# Patient Record
Sex: Male | Born: 1980 | Hispanic: No | Marital: Single | Smoking: Former smoker
Health system: Southern US, Community
[De-identification: ages and names within clinical notes are randomized; demographics above are authoritative.]

## PROBLEM LIST (undated history)

## (undated) DIAGNOSIS — M545 Low back pain: Secondary | ICD-10-CM

## (undated) DIAGNOSIS — G8929 Other chronic pain: Secondary | ICD-10-CM

## (undated) HISTORY — DX: Low back pain: M54.5

## (undated) HISTORY — DX: Other chronic pain: G89.29

## (undated) HISTORY — PX: COLONOSCOPY: SHX174

---

## 2014-11-02 ENCOUNTER — Ambulatory Visit (INDEPENDENT_AMBULATORY_CARE_PROVIDER_SITE_OTHER): Payer: PRIVATE HEALTH INSURANCE | Admitting: Family Medicine

## 2014-11-02 ENCOUNTER — Encounter: Payer: Self-pay | Admitting: Family Medicine

## 2014-11-02 VITALS — BP 137/86 | HR 84 | Ht 68.0 in | Wt 148.0 lb

## 2014-11-02 DIAGNOSIS — M545 Low back pain, unspecified: Secondary | ICD-10-CM | POA: Insufficient documentation

## 2014-11-02 DIAGNOSIS — G8929 Other chronic pain: Secondary | ICD-10-CM

## 2014-11-02 DIAGNOSIS — R011 Cardiac murmur, unspecified: Secondary | ICD-10-CM | POA: Diagnosis not present

## 2014-11-02 HISTORY — DX: Other chronic pain: G89.29

## 2014-11-02 HISTORY — DX: Low back pain, unspecified: M54.50

## 2014-11-02 NOTE — Assessment & Plan Note (Signed)
Unknown etiology of murmur. Echocardiogram ordered. Follow-up following echo. EKG normal. Patient asymptomatic.

## 2014-11-02 NOTE — Patient Instructions (Signed)
Thank you for coming in today.  1) Back pain:  Do the exercises below. Call or return when you are ready to do an xray and physical therapy.   2) Heart Murmur: We will schedule your for an ultrasound of the heart: Echocardiogram (Echo) this will tell us why you have a murmur.  Return following heart ultrasound.    Heart Murmur A heart murmur is an extra sound heard by your health care provider when listening to your heart with a device called a stethoscope. The sound comes from turbulence when blood flows through the heart and may be a "hum" or "whoosh" sound heard when the heart beats. There are two types of heart murmurs:  Innocent murmurs. Most people with this type of heart murmur do not have a heart problem. Many children have innocent heart murmurs. Your health care provider may suggest some basic testing to know whether your murmur is an innocent murmur. If an innocent heart murmur is found, there is no need for further tests or treatment and no need to restrict activities or stop playing sports.  Abnormal murmurs. These types of murmurs can occur in children and adults. In children, abnormal heart murmurs are typically caused from heart defects that are present at birth (congenital). In adults, abnormal murmurs are usually from heart valve problems caused by disease, infection, or aging. CAUSES  Normally, these valves open to let blood flow through or out of your heart and then shut to keep it from flowing backward. If they do not work properly, you could have:  Regurgitation--When blood leaks back through the valve in the wrong direction.  Mitral valve prolapse--When the mitral valve of the heart has a loose flap and does not close tightly.  Stenosis--When the valve does not open enough and blocks blood flow. SIGNS AND SYMPTOMS  Innocent murmurs do not cause symptoms, and many people with abnormal murmurs may or may not have symptoms. If symptoms do develop, they may  include:  Shortness of breath.  Blue coloring of the skin, especially on the fingertips.  Chest pain.  Palpitations, or feeling a fluttering or skipped heartbeat.  Fainting.  Persistent cough.  Getting tired much faster than expected. DIAGNOSIS  A heart murmur might be heard during a sports physical or during any type of examination. When a murmur is heard, it may suggest a possible problem. When this happens, your health care provider may ask you to see a heart specialist (cardiologist). You may also be asked to have one or more heart tests. In these cases, testing may vary depending on what your health care provider heard. Tests for a heart murmur may include:  Electrocardiogram.  Echocardiogram.  MRI. For children and adults who have an abnormal heart murmur and want to play sports, it is important to complete testing, review test results, and receive recommendations from your health care provider. If heart disease is present, it may not be safe to play. TREATMENT  Innocent murmurs require no treatment or activity restriction. If an abnormal murmur represents a problem with the heart, treatment will depend on the exact nature of the problem. In these cases, medicine or surgery may be needed to treat the problem. HOME CARE INSTRUCTIONS If you want to participate in sports or other types of strenuous physical activity, it is important to discuss this first with your health care provider. If the murmur represents a problem with the heart and you choose to participate in sports, there is a small chance that  a serious problem (including sudden death) could result.  SEEK MEDICAL CARE IF:   You feel that your symptoms are slowly worsening.  You develop any new symptoms that cause concern.  You feel that you are having side effects from any medicines prescribed. SEEK IMMEDIATE MEDICAL CARE IF:   You develop chest pain.  You have shortness of breath.  You notice that your heart  beats irregularly often enough to cause you to worry.  You have fainting spells.  Your symptoms suddenly get worse.   This information is not intended to replace advice given to you by your health care provider. Make sure you discuss any questions you have with your health care provider.   Document Released: 01/27/2004 Document Revised: 01/09/2014 Document Reviewed: 08/26/2012 Elsevier Interactive Patient Education 2016 [place] Injury Prevention Back injuries can be very painful. They can also be difficult to heal. After having one back injury, you are more likely to injure your back again. It is important to learn how to avoid injuring or re-injuring your back. The following tips can help you to prevent a back injury. WHAT SHOULD I KNOW ABOUT PHYSICAL FITNESS?  Exercise for 30 minutes per day on most days of the week or as told by your doctor. Make sure to:  Do aerobic exercises, such as walking, jogging, biking, or swimming.  Do exercises that increase balance and strength, such as tai chi and yoga.  Do stretching exercises. This helps with flexibility.  Try to develop strong belly (abdominal) muscles. Your belly muscles help to support your back.  Stay at a healthy weight. This helps to decrease your risk of a back injury. WHAT SHOULD I KNOW ABOUT MY DIET?  Talk with your doctor about your overall diet. Take supplements and vitamins only as told by your doctor.  Talk with your doctor about how much calcium and vitamin D you need each day. These nutrients help to prevent weakening of the bones (osteoporosis).  Include good sources of calcium in your diet, such as:  Dairy products.  Green leafy vegetables.  Products that have had calcium added to them (fortified).  Include good sources of vitamin D in your diet, such as:  Milk.  Foods that have had vitamin D added to them. WHAT SHOULD I KNOW ABOUT MY POSTURE?  Sit up straight and stand up straight. Avoid  leaning forward when you sit or hunching over when you stand.  Choose chairs that have good low-back (lumbar) support.  If you work at a desk, sit close to it so you do not need to lean over. Keep your chin tucked in. Keep your neck drawn back. Keep your elbows bent so your arms look like the letter "L" (right angle).  Sit high and close to the steering wheel when you drive. Add a low-back support to your car seat, if needed.  Avoid sitting or standing in one position for very long. Take breaks to get up, stretch, and walk around at least one time every hour. Take breaks every hour if you are driving for long periods of time.  Sleep on your side with your knees slightly bent, or sleep on your back with a pillow under your knees. Do not lie on the front of your body to sleep. WHAT SHOULD I KNOW ABOUT LIFTING, TWISTING, AND REACHING Lifting and Heavy Lifting  Avoid heavy lifting, especially lifting over and over again. If you must do heavy lifting:  Stretch before lifting.  Work slowly.  Rest between lifts.  Use a tool such as a cart or a dolly to move objects if one is available.  Make several small trips instead of carrying one heavy load.  Ask for help when you need it, especially when moving big objects.  Follow these steps when lifting:  Stand with your feet shoulder-width apart.  Get as close to the object as you can. Do not pick up a heavy object that is far from your body.  Use handles or lifting straps if they are available.  Bend at your knees. Squat down, but keep your heels off the floor.  Keep your shoulders back. Keep your chin tucked in. Keep your back straight.  Lift the object slowly while you tighten the muscles in your legs, belly, and butt. Keep the object as close to the center of your body as possible.  Follow these steps when putting down a heavy load:  Stand with your feet shoulder-width apart.  Lower the object slowly while you tighten the muscles  in your legs, belly, and butt. Keep the object as close to the center of your body as possible.  Keep your shoulders back. Keep your chin tucked in. Keep your back straight.  Bend at your knees. Squat down, but keep your heels off the floor.  Use handles or lifting straps if they are available. Twisting and Reaching  Avoid lifting heavy objects above your waist.  Do not twist at your waist while you are lifting or carrying a load. If you need to turn, move your feet.  Do not bend over without bending at your knees.  Avoid reaching over your head, across a table, or for an object on a high surface.  WHAT ARE SOME OTHER TIPS?  Avoid wet floors and icy ground. Keep sidewalks clear of ice to prevent falls.   Do not sleep on a mattress that is too soft or too hard.   Keep items that you use often within easy reach.   Put heavier objects on shelves at waist level, and put lighter objects on lower or higher shelves.  Find ways to lower your stress, such as:  Exercise.  Massage.  Relaxation techniques.  Talk with your doctor if you feel anxious or depressed. These conditions can make back pain worse.  Wear flat heel shoes with cushioned soles.  Avoid making quick (sudden) movements.  Use both shoulder straps when carrying a backpack.  Do not use any tobacco products, including cigarettes, chewing tobacco, or electronic cigarettes. If you need help quitting, ask your doctor.   This information is not intended to replace advice given to you by your health care provider. Make sure you discuss any questions you have with your health care provider.   Document Released: 06/07/2007 Document Revised: 05/05/2014 Document Reviewed: 12/23/2013 Elsevier Interactive Patient Education [employer].

## 2014-11-02 NOTE — Progress Notes (Signed)
Julian Martinez is a 34 y.o. male who [facility]  today for establish care and discuss back pain. 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 He denies any specific injury. He notes low back pain and tightness with an following work. He denies any radiating pain weakness or numbness fevers or chills. He denies any chest pains palpitations shortness of breath. He denies any bowel bladder dysfunction. He denies any personal history of heart murmur palpitations trouble breathing.   History reviewed. No pertinent past medical history. History reviewed. No pertinent past surgical history. Social History  Substance Use Topics  . Smoking status: Former Games developermoker  . Smokeless tobacco: Not on file  . Alcohol Use: 0.0 oz/week    0 Standard drinks or equivalent per week   family history includes Heart disease in his paternal grandmother.  ROS as above Medications: No current outpatient prescriptions on file.   No current facility-administered medications for this visit.   No Known Allergies   Exam:  BP 137/86 mmHg  Pulse 84  Ht 5\' 8"  (1.727 m)  Wt 148 lb (67.132 kg)  BMI 22.51 kg/m2 Gen: Well NAD HEENT: EOMI,  MMM Lungs: Normal work of breathing. CTABL Heart: RRR  3/6 systolic murmur best heard at the left lower sternal border nonradiating to the neck.  Pulses are symmetric in upper and lower extremity. Abd: NABS, Soft. Nondistended, Nontender  Exts: Brisk capillary refill, warm and well perfused.  Back: Nontender to midline. Tender palpation mildly bilateral lumbar paraspinals and SI joint. Normal back range of motion. Negative Faber test history leg raise test bilaterally. Lower extremity Strength and reflexes are intact. Normal gait. Sensation is intact throughout  Twelve-lead EKG shows normal sinus rhythm 86 bpm. No ST segment elevation or depression. Normal EKG. No prior EKGs to  compare to.  No results found for this or any previous visit (from the past 24 hour(s)). No results found.   Please see individual assessment and plan sections.

## 2014-11-02 NOTE — Addendum Note (Signed)
Addended by: Minna AntisBRIGHAM, EBONY T on: 11/02/2014 03:47 PM   Modules accepted: Orders

## 2014-11-02 NOTE — Assessment & Plan Note (Signed)
Patient's chief complaint today. I suspect his pain is likely due to muscle soreness and tightness. Discussed options. Patient declined x-ray and refer to physical therapy at this time. We'll home exercises and return if not better.

## 2014-11-10 ENCOUNTER — Ambulatory Visit: Payer: PRIVATE HEALTH INSURANCE

## 2016-08-23 ENCOUNTER — Ambulatory Visit (INDEPENDENT_AMBULATORY_CARE_PROVIDER_SITE_OTHER): Payer: PRIVATE HEALTH INSURANCE | Admitting: Family Medicine

## 2016-08-23 ENCOUNTER — Encounter: Payer: Self-pay | Admitting: Family Medicine

## 2016-08-23 VITALS — BP 123/80 | HR 93 | Temp 98.5°F | Wt 148.0 lb

## 2016-08-23 DIAGNOSIS — K625 Hemorrhage of anus and rectum: Secondary | ICD-10-CM | POA: Diagnosis not present

## 2016-08-23 LAB — CBC
HEMATOCRIT: 49.8 % (ref 38.5–50.0)
Hemoglobin: 17.1 g/dL (ref 13.2–17.1)
MCH: 34.1 pg — AB (ref 27.0–33.0)
MCHC: 34.3 g/dL (ref 32.0–36.0)
MCV: 99.4 fL (ref 80.0–100.0)
MPV: 8.6 fL (ref 7.5–12.5)
Platelets: 302 10*3/uL (ref 140–400)
RBC: 5.01 MIL/uL (ref 4.20–5.80)
RDW: 13.6 % (ref 11.0–15.0)
WBC: 5.1 10*3/uL (ref 3.8–10.8)

## 2016-08-23 NOTE — Patient Instructions (Signed)
Thank you for coming in today. Get labs today.  You should hear from [facility] soon.  Let me know if you do not hear from them next week.   If your belly pain worsens, or you have high fever, bad vomiting, significant worse blood in your stool or black tarry stool go to the Emergency Room.    Rectal Bleeding Rectal bleeding is when blood passes out of the anus. People with rectal bleeding may notice bright red blood in their underwear or in the toilet after having a bowel movement. They may also have dark red or black stools. Rectal bleeding is usually a sign that something is wrong. Many things can cause rectal bleeding, including:  Hemorrhoids. These are blood vessels in the anus or rectum that are larger than normal.  Fistulas. These are abnormal passages in the rectum and anus.  Anal fissures. This is a tear in the anus.  Diverticulosis. This is a condition in which pockets or sacs project from the bowel.  Proctitis and colitis. These are conditions in which the rectum, colon, or anus become inflamed.  Polyps. These are growths that can be cancerous (malignant) or non-cancerous (benign).  Part of the rectum sticking out from the anus (rectal prolapse).  Certain medicines.  Intestinal infections.  Follow these instructions at home: Pay attention to any changes in your symptoms. Take these actions to help lessen bleeding and discomfort:  Eat a diet that is high in fiber. This will keep your stool soft, making it easier to pass stools without straining. Ask your health care provider what foods and drinks are high in fiber.  Drink enough fluid to keep your urine clear or pale yellow. This also helps to keep your stool soft.  Try taking a warm bath. This may help soothe any pain in your rectum.  Keep all follow-up visits as told by your health care provider. This is important.  Get help right away if:  You have new or increased rectal bleeding.  You have black or  dark red stools.  You vomit blood or something that looks like coffee grounds.  You have pain or tenderness in your abdomen.  You have a fever.  You feel weak.  You feel nauseous.  You faint.  You have severe pain in your rectum.  You cannot have a bowel movement. This information is not intended to replace advice given to you by your health care provider. Make sure you discuss any questions you have with your health care provider. Document Released: 06/10/2001 Document Revised: 05/27/2015 Document Reviewed: 02/14/2015 Elsevier Interactive Patient Education  [employer].

## 2016-08-23 NOTE — Progress Notes (Signed)
       Jerardo Markwood is a 36 y.o. male who presents to [facility] today for bright red blood per rectum. Patient has a several week history of intermittent pain with rectal bleeding. This occurs after bowel movements. He notes the blood is bright red and in the toilet bowl and on the toilet paper. He denies any fevers chills nausea vomiting or diarrhea. He denies significant constipation. He feels well otherwise. He has not tried any treatment for this.   Past Medical History:  Diagnosis Date  . Chronic bilateral low back pain without sciatica 11/02/2014   No past surgical history on file. Social History  Substance Use Topics  . Smoking status: Former Games developer  . Smokeless tobacco: Not on file  . Alcohol use 0.0 oz/week   family history includes Heart disease in his paternal grandmother.  ROS as above:  Medications: No current outpatient prescriptions on file.   No current facility-administered medications for this visit.    No Known Allergies  Health Maintenance Health Maintenance  Topic Date Due  . HIV Screening  01/11/1995  . TETANUS/TDAP  01/11/1999  . INFLUENZA VACCINE  08/23/2017 (Originally 08/02/2016)     Exam:  BP 123/80   Pulse 93   Temp 98.5 F (36.9 C) (Oral)   Wt 148 lb (67.1 kg)   BMI 22.50 kg/m   Gen: Well NAD HEENT: EOMI,  MMM Lungs: Normal work of breathing. CTABL Heart: RRR no MRG Heart rate 90 bpm per my check Abd: NABS, Soft. Nondistended, Nontender Exts: Brisk capillary refill, warm and well perfused.  Rectal exam shows normal-appearing anus. No rectal masses nontender normal sized prostate. No visible blood.   No results found for this or any previous visit (from the past 72 hour(s)). No results found.    Assessment and Plan: 36 y.o. male with intermittent painless rectal bleeding. This is concerning for internal  hemorrhoid or diverticulosis. However it should be worked up. Plan for CBC and metabolic panel and refer to gastroenterology for likely colonoscopy. Recheck as needed.   Orders Placed This Encounter  Procedures  . CBC  . COMPLETE METABOLIC PANEL WITH GFR  . Ambulatory referral to Gastroenterology    Referral Priority:   Routine    Referral Type:   Consultation    Referral Reason:   Specialty Services Required    Requested Specialty:   Gastroenterology    Number of Visits Requested:   1   No orders of the defined types were placed in this encounter.    Discussed warning signs or symptoms. Please see discharge instructions. Patient expresses understanding.

## 2016-08-24 LAB — COMPLETE METABOLIC PANEL WITH GFR
ALT: 27 U/L (ref 9–46)
AST: 30 U/L (ref 10–40)
Albumin: 4.7 g/dL (ref 3.6–5.1)
Alkaline Phosphatase: 76 U/L (ref 40–115)
BUN: 12 mg/dL (ref 7–25)
CALCIUM: 9.9 mg/dL (ref 8.6–10.3)
CHLORIDE: 102 mmol/L (ref 98–110)
CO2: 22 mmol/L (ref 20–32)
CREATININE: 0.94 mg/dL (ref 0.60–1.35)
Glucose, Bld: 80 mg/dL (ref 65–99)
Potassium: 4.4 mmol/L (ref 3.5–5.3)
Sodium: 138 mmol/L (ref 135–146)
Total Bilirubin: 0.7 mg/dL (ref 0.2–1.2)
Total Protein: 7.6 g/dL (ref 6.1–8.1)

## 2016-10-27 LAB — HM COLONOSCOPY

## 2018-06-28 ENCOUNTER — Encounter (HOSPITAL_COMMUNITY): Payer: Self-pay

## 2018-06-28 ENCOUNTER — Ambulatory Visit (INDEPENDENT_AMBULATORY_CARE_PROVIDER_SITE_OTHER): Payer: PRIVATE HEALTH INSURANCE

## 2018-06-28 ENCOUNTER — Other Ambulatory Visit: Payer: Self-pay

## 2018-06-28 ENCOUNTER — Ambulatory Visit (HOSPITAL_COMMUNITY)
Admission: EM | Admit: 2018-06-28 | Discharge: 2018-06-28 | Disposition: A | Discharge status: Home / Self Care | Payer: PRIVATE HEALTH INSURANCE | Attending: Family Medicine | Admitting: Family Medicine

## 2018-06-28 DIAGNOSIS — S2222XA Fracture of body of sternum, initial encounter for closed fracture: Secondary | ICD-10-CM

## 2018-06-28 MED ORDER — IBUPROFEN 800 MG PO TABS: 800.0000 mg | ORAL_TABLET | Freq: Three times a day (TID) | ORAL | 0 refills | Status: DC | PRN

## 2018-06-28 NOTE — ED Provider Notes (Signed)
[facility]    CSN: [id number] Arrival date & time: 06/28/18  [id number]      History   Chief Complaint Chief Complaint  Patient presents with  . Appointment    8:50  . Pulled Muscle    HPI Julian Martinez is a 38 y.o. male.   HPI  Here with rib and anteroir chest wall pain.  Was seen at an urgent care center 3 d ago and told to return if not improving. His original injury is not severe - he was giving an old friend a bear hug, and the man resisted- joking- and he felt a pop an they both heard a pop in his chest.  He has had pain ever since.  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  H eis having increasing difficulty with the heavy work and increasing pain in his anterior chest Nonsmoker healthy  Past Medical History:  Diagnosis Date  . Chronic bilateral low back pain without sciatica 11/02/2014    Patient Active Problem List   Diagnosis Date Noted  . BRBPR (bright red blood per rectum) 08/23/2016  . Chronic bilateral low back pain without sciatica 11/02/2014    History reviewed. No pertinent surgical history.     Home Medications    Prior to Admission medications   Medication Sig Start Date End Date Taking? Authorizing Provider  ibuprofen (ADVIL) 800 MG tablet Take 1 tablet (800 mg total) by mouth every 8 (eight) hours as needed for moderate pain. 06/28/18   Eustace MooreNelson, Estevon Fluke Sue, MD    Family History Family History  Problem Relation Age of Onset  . Heart disease Paternal Grandmother   . Healthy Mother   . Healthy Father     Social History Social History   Tobacco Use  . Smoking status: Former Games developermoker  . Smokeless tobacco: Never Used  Substance Use Topics  . Alcohol use: Yes    Alcohol/week: 0.0 standard drinks    Comment: daily  . Drug use: Not on file     Allergies   Patient has no known allergies.   Review of Systems Review of Systems  Constitutional: Negative for chills and  fever.  HENT: Negative for ear pain and sore throat.   Eyes: Negative for pain and visual disturbance.  Respiratory: Positive for chest tightness. Negative for cough and shortness of breath.   Cardiovascular: Positive for chest pain. Negative for palpitations.  Gastrointestinal: Negative for abdominal pain and vomiting.  Genitourinary: Negative for dysuria and hematuria.  Musculoskeletal: Negative for arthralgias and back pain.  Skin: Negative for color change and rash.  Neurological: Negative for seizures and syncope.  All other systems reviewed and are negative.    Physical Exam Triage Vital Signs ED Triage Vitals  Enc Vitals Group     BP 06/28/18 0901 (!) 145/102     Pulse Rate 06/28/18 0901 100     Resp 06/28/18 0901 18     Temp 06/28/18 0901 98.4 F (36.9 C)     Temp Source 06/28/18 0901 Oral     SpO2 06/28/18 0901 100 %     Weight --      Height --      Head Circumference --      Peak Flow --      Pain Score 06/28/18 0857 7     Pain Loc --      Pain Edu? --  Excl. in GC? --    No data found.  Updated Vital Signs BP (!) 130/93 (BP Location: Left Arm)   Pulse 100   Temp 98.4 F (36.9 C) (Oral)   Resp 18   SpO2 100%   Visual Acuity Right Eye Distance:   Left Eye Distance:   Bilateral Distance:    Right Eye Near:   Left Eye Near:    Bilateral Near:     Physical Exam Constitutional:      General: He is not in acute distress.    Appearance: He is well-developed and normal weight.  HENT:     Head: Normocephalic and atraumatic.     Nose: Nose normal.     Mouth/Throat:     Mouth: Mucous membranes are moist.  Eyes:     Conjunctiva/sclera: Conjunctivae normal.     Pupils: Pupils are equal, round, and reactive to light.  Neck:     Musculoskeletal: Normal range of motion and neck supple.  Cardiovascular:     Rate and Rhythm: Normal rate and regular rhythm.     Heart sounds: Murmur present. Systolic murmur present with a grade of 2/6.  Pulmonary:      Effort: Pulmonary effort is normal. No respiratory distress.     Breath sounds: Normal breath sounds.  Chest:     Chest wall: Tenderness present.    Abdominal:     General: Abdomen is flat. There is no distension.     Palpations: Abdomen is soft.     Tenderness: There is no abdominal tenderness.  Musculoskeletal: Normal range of motion.  Skin:    General: Skin is warm and dry.  Neurological:     General: No focal deficit present.     Mental Status: He is alert.  Psychiatric:        Mood and Affect: Mood normal.        Behavior: Behavior normal.      UC Treatments / Results  Labs (all labs ordered are listed, but only abnormal results are displayed) Labs Reviewed - No data to display  EKG None  Radiology Dg Chest 2 View  Result Date: 06/28/2018 CLINICAL DATA:  Anterior chest pain EXAM: CHEST - 2 VIEW COMPARISON:  None. FINDINGS: There is no focal consolidation. There is no pleural effusion or pneumothorax. The heart and mediastinal contours are unremarkable. There is cortical destruction of the mid aspect of sternum concerning for a nondisplaced fracture. IMPRESSION: 1. Cortical destruction of the mid aspect of sternum concerning for a nondisplaced fracture. 2. No acute cardiopulmonary disease. Electronically Signed   By: Elige KoHetal  Patel   On: 06/28/2018 09:51    Procedures Procedures (including critical care time)  Medications Ordered in UC Medications - No data to display  Initial Impression / Assessment and Plan / UC Course  I have reviewed the triage vital signs and the nursing notes.  Pertinent labs & imaging results that were available during my care of the patient were reviewed by me and considered in my medical decision making (see chart for details).     Reviewed x ray findings with patient. Expect full recovery.  Activity limitation recommended.  No specific treatment available Final Clinical Impressions(s) / UC Diagnoses   Final diagnoses:  Closed  fracture of body of sternum, initial encounter     Discharge Instructions     Ibuprofen as needed pain Avoid heavy lifting and activity for a month or two Expect gradual improvement Call your PCP if you fail to  improve     ED Prescriptions    Medication Sig Dispense Auth. Provider   ibuprofen (ADVIL) 800 MG tablet Take 1 tablet (800 mg total) by mouth every 8 (eight) hours as needed for moderate pain. 90 tablet Raylene Everts, MD     Controlled Substance Prescriptions [place] Controlled Substance Registry consulted? Not Applicable   Raylene Everts, MD 06/28/18 1125

## 2018-06-28 NOTE — ED Triage Notes (Signed)
Patient presents to Urgent Care with complaints of feeling like he pulled a pectoral muscle on the right side since last week, pain has been worse over the past few days. Patient reports he felt a pop when he first did it and now certain movements exacerbate his pain.

## 2018-06-28 NOTE — Discharge Instructions (Signed)
Ibuprofen as needed pain Avoid heavy lifting and activity for a month or two Expect gradual improvement Call your PCP if you fail to improve

## 2018-07-22 ENCOUNTER — Other Ambulatory Visit: Payer: Self-pay

## 2018-07-22 ENCOUNTER — Encounter (HOSPITAL_COMMUNITY): Payer: Self-pay

## 2018-07-22 ENCOUNTER — Ambulatory Visit (INDEPENDENT_AMBULATORY_CARE_PROVIDER_SITE_OTHER): Payer: PRIVATE HEALTH INSURANCE

## 2018-07-22 ENCOUNTER — Ambulatory Visit (HOSPITAL_COMMUNITY)
Admission: EM | Admit: 2018-07-22 | Discharge: 2018-07-22 | Disposition: A | Discharge status: Home / Self Care | Payer: PRIVATE HEALTH INSURANCE | Attending: Emergency Medicine | Admitting: Emergency Medicine

## 2018-07-22 DIAGNOSIS — S82035A Nondisplaced transverse fracture of left patella, initial encounter for closed fracture: Secondary | ICD-10-CM

## 2018-07-22 DIAGNOSIS — M25462 Effusion, left knee: Secondary | ICD-10-CM | POA: Diagnosis not present

## 2018-07-22 MED ORDER — ACETAMINOPHEN 325 MG PO TABS
ORAL_TABLET | ORAL | Status: AC
  Filled 2018-07-22: qty 3

## 2018-07-22 MED ORDER — IBUPROFEN 800 MG PO TABS
ORAL_TABLET | ORAL | Status: AC
  Filled 2018-07-22: qty 1

## 2018-07-22 MED ORDER — HYDROCODONE-ACETAMINOPHEN 5-325 MG PO TABS: 1.0000 | ORAL_TABLET | Freq: Four times a day (QID) | ORAL | 0 refills | Status: DC | PRN

## 2018-07-22 MED ORDER — IBUPROFEN 800 MG PO TABS
800.0000 mg | ORAL_TABLET | Freq: Once | ORAL | Status: AC
  Administered 2018-07-22: 10:00:00 800 mg via ORAL

## 2018-07-22 MED ORDER — ACETAMINOPHEN 325 MG PO TABS
975.0000 mg | ORAL_TABLET | Freq: Once | ORAL | Status: AC
  Administered 2018-07-22: 975 mg via ORAL

## 2018-07-22 NOTE — Discharge Instructions (Addendum)
You may weight-bear while wearing the knee immobilizer.  Wear the knee immobilizer at all times.  Tylenol-containing products 3-4 times a day as needed for pain.  1 g for mild to moderate pain, 1-2 Norco for severe pain.  May continue ice.  You need to follow-up with Dr. Alvan Dame this week to discuss surgery.

## 2018-07-22 NOTE — ED Triage Notes (Signed)
Pt presents with left knee injury from slamming it on a rock while tubing yesterday.

## 2018-07-22 NOTE — ED Provider Notes (Signed)
HPI  SUBJECTIVE:  Julian Martinez is a 38 y.o. male who presents with left knee swelling, burning pain with weightbearing and movement after hitting it against a rock while tubing yesterday.  He states there is no pain at rest.  States that he hit his knee superior to the patella.  He states that he is unable to bear weight on it.  He denies numbness or tingling distally,.  States that his ankle and foot are not injured.  He tried ice, heat, wrapping it, 400 mg ibuprofen and started using a walker this morning.  The ibuprofen helps somewhat.  Symptoms worse with weightbearing, movement.  Past medical history of a nondisplaced sternal fracture last month.  No history of osteoporosis.  PMD: None.    Past Medical History:  Diagnosis Date  . Chronic bilateral low back pain without sciatica 11/02/2014    History reviewed. No pertinent surgical history.  Family History  Problem Relation Age of Onset  . Heart disease Paternal Grandmother   . Healthy Mother   . Healthy Father     Social History   Tobacco Use  . Smoking status: Former Games developermoker  . Smokeless tobacco: Never Used  Substance Use Topics  . Alcohol use: Yes    Alcohol/week: 0.0 standard drinks    Comment: daily  . Drug use: Not on file    No current facility-administered medications for this encounter.   Current Outpatient Medications:  .  HYDROcodone-acetaminophen (NORCO/VICODIN) 5-325 MG tablet, Take 1-2 tablets by mouth every 6 (six) hours as needed for moderate pain or severe pain., Disp: 12 tablet, Rfl: 0 .  ibuprofen (ADVIL) 800 MG tablet, Take 1 tablet (800 mg total) by mouth every 8 (eight) hours as needed for moderate pain., Disp: 90 tablet, Rfl: 0  No Known Allergies   ROS  As noted in HPI.   Physical Exam  BP (!) 142/96 (BP Location: Right Arm)   Pulse (!) 129   Temp 99 F (37.2 C) (Oral)   Resp 16   SpO2 96%   Constitutional: Well developed, well nourished, mild painful distress Eyes:  EOMI,  conjunctiva normal bilaterally HENT: Normocephalic, atraumatic,mucus membranes moist Respiratory: Normal inspiratory effort Cardiovascular: Normal rate GI: nondistended skin: No rash, skin intact Musculoskeletal: L Knee decreased due to pain, pain with flexion/extension, Patient has difficulty fully extending the knee flexion  Intact, positive swelling superior knee.  Positive tenderness medial joint and superior patella.  Positive bruise inferior lateral patella/lateral joint line, patella tender,Patellar tendon NT,  Lateral joint NT, Varus LCL stress testing stable, Valgus MCL stress testing stable, McMurray's testing  abnormal, Lachman's negative. Distal NVI with intact baseline sensation / motor / pulse distal to knee. + effusion. No erythema. No increased temperature. No crepitus.  Neurologic: Alert & oriented x 3, no focal neuro deficits Psychiatric: Speech and behavior appropriate   ED Course   Medications  acetaminophen (TYLENOL) tablet 975 mg (975 mg Oral Given 07/22/18 0958)  ibuprofen (ADVIL) tablet 800 mg (800 mg Oral Given 07/22/18 0958)  ibuprofen (ADVIL) 800 MG tablet (has no administration in time range)  acetaminophen (TYLENOL) 325 MG tablet (has no administration in time range)    Orders Placed This Encounter  Procedures  . DG Knee Complete 4 Views Left    Standing Status:   Standing    Number of Occurrences:   1    Order Specific Question:   Reason for Exam (SYMPTOM  OR DIAGNOSIS REQUIRED)    Answer:   direct  trauma, Tender medial joint , superior patella.  Rule out effusion, fx  . Recheck vitals    Repeat HR please    Standing Status:   Standing    Number of Occurrences:   1  . Apply knee immobilizer    Standing Status:   Standing    Number of Occurrences:   1    Order Specific Question:   Laterality    Answer:   Left    No results found for this or any previous visit (from the past 24 hour(s)). Dg Knee Complete 4 Views Left  Result Date:  07/22/2018 CLINICAL DATA:  Direct trauma, knee pain. EXAM: LEFT KNEE - COMPLETE 4+ VIEW COMPARISON:  None. FINDINGS: There is a transverse fracture through the mid pole of the left patella. The fracture extends into the lower pole. Moderate joint effusion. No additional acute bony abnormality. IMPRESSION: Comminuted fracture through the mid and lower pole of the left patella. Moderate joint effusion. Electronically Signed   By: Rolm Baptise M.D.   On: 07/22/2018 10:13    ED Clinical Impression  1. Closed nondisplaced transverse fracture of left patella, initial encounter   2. Effusion of left knee      ED Assessment/Plan  Heart rate noted.  Patient states that is "always fast" when he is at the doctor and is nervous.  We will give 975 mg of Tylenol with 800 mg of ibuprofen.  Obtaining x-ray due to the swelling of the knee.  Patient brings in a walker.  Reviewed imaging independently.  Transverse fracture through the mid pole of the left patella extending into the lower pole.  See radiology report for full details.  [place] Narcotic database reviewed for this patient, and feel that the risk/benefit ratio today is favorable for proceeding with a prescription for controlled substance.  No opiate prescriptions in 2 years.  1018-paged PA Hilbert Odor, orthopedics on call  1020-discussed with PA Dellis Filbert.  This is going to require surgery.  Okay to send him home with a knee immobilizer.  He may weight-bear while wearing the knee immobilizer.  Follow-up with Dr. Alvan Dame within this week.   Placing in a knee immobilizer, continue using a walker but am sending home with crutches 1 g of Tylenol for mild to moderate pain or 1-2 Norco for severe pain.  May take Tylenol-containing products 3-4 times a day.  Follow-up as above.  Discussed  imaging, MDM, treatment plan, and plan for follow-up with patient. Discussed sn/sx that should prompt return to the ED. patient agrees with plan.   Meds ordered this  encounter  Medications  . acetaminophen (TYLENOL) tablet 975 mg  . ibuprofen (ADVIL) tablet 800 mg  . HYDROcodone-acetaminophen (NORCO/VICODIN) 5-325 MG tablet    Sig: Take 1-2 tablets by mouth every 6 (six) hours as needed for moderate pain or severe pain.    Dispense:  12 tablet    Refill:  0    *This clinic note was created using Lobby[employer]. Therefore, there may be occasional mistakes despite careful proofreading.   ?   Melynda Ripple, MD 07/22/18 1034

## 2018-07-26 ENCOUNTER — Ambulatory Visit: Payer: Self-pay | Admitting: Orthopedic Surgery

## 2018-07-29 ENCOUNTER — Other Ambulatory Visit (HOSPITAL_COMMUNITY)
Admission: RE | Admit: 2018-07-29 | Discharge: 2018-07-29 | Disposition: A | Discharge status: Home / Self Care | Payer: PRIVATE HEALTH INSURANCE | Source: Ambulatory Visit | Attending: Orthopedic Surgery | Admitting: Orthopedic Surgery

## 2018-07-29 DIAGNOSIS — Z20828 Contact with and (suspected) exposure to other viral communicable diseases: Secondary | ICD-10-CM | POA: Insufficient documentation

## 2018-07-29 LAB — SARS CORONAVIRUS 2 (TAT 6-24 HRS): SARS Coronavirus 2: NEGATIVE

## 2018-07-30 NOTE — Patient Instructions (Signed)
, PLEASE BEGIN THE QUARANTINE INSTRUCTIONS AS OUTLINED IN YOUR HANDOUT.                Julian Martinez    Your procedure is scheduled on Thursday 08/01/18   [facility]  Entrance Report to admitting at 11:05 AM   1 VISITOR IS ALLOWED TO WAIT IN WAITING ROOM  ONLY DAY OF YOUR SURGERY.    Call this number if you have problems the morning of surgery [phone]    Remember: Do not eat food after Midnight.   . YOU MAY HAVE CLEAR LIQUIDS FROM MIDNIGHT UNTIL 10:05AM . At 10:05 AM Please finish the prescribed Pre-Surgery  drink. Nothing by mouth after you finish the  drink !   CLEAR LIQUID DIET   Foods Allowed                                                                     Foods Excluded  Coffee and tea, regular and decaf                             liquids that you cannot  Plain Jell-O any favor except red or purple                                           see through such as: Fruit ices (not with fruit pulp)                                     milk, soups, orange juice  Iced Popsicles                                    All solid food Carbonated beverages, regular and diet                                    Cranberry, grape and apple juices Sports drinks like Gatorade Lightly seasoned clear broth or consume(fat free) Sugar, honey syrup   _____________________________________________________________________   BRUSH YOUR TEETH MORNING OF SURGERY AND RINSE YOUR MOUTH OUT, NO CHEWING GUM CANDY OR MINTS.     Take these medicines the morning of surgery with A SIP OF WATER: none                                You may not have any metal on your body including hair pins and piercings             Do not wear jewelry, , lotions, powders or  deodorant                          Men may shave face and neck.   Do not bring valuables to the hospital. [facility] IS NOT  RESPONSIBLE   FOR VALUABLES.  Contacts, dentures or bridgework may not be  worn into surgery.       Patients discharged the day of surgery will not be allowed to drive home.  IF YOU ARE HAVING SURGERY AND GOING HOME THE SAME DAY, YOU MUST HAVE AN ADULT TO DRIVE YOU HOME AND BE WITH YOU FOR 24 HOURS.  YOU MAY GO HOME BY TAXI OR UBER OR ORTHERWISE, BUT AN ADULT MUST ACCOMPANY YOU HOME AND STAY WITH YOU FOR 24 HOURS.  Name and phone number of your driver:  Special Instructions: N/A              Please read over the following fact sheets you were given: _____________________________________________________________________    Incentive Spirometer  An incentive spirometer is a tool that can help keep your lungs clear and active. This tool measures how well you are filling your lungs with each breath. Taking long deep breaths may help reverse or decrease the chance of developing breathing (pulmonary) problems (especially infection) following:  A long period of time when you are unable to move or be active. BEFORE THE PROCEDURE   If the spirometer includes an indicator to show your best effort, your nurse or respiratory therapist will set it to a desired goal.  If possible, sit up straight or lean slightly forward. Try not to slouch.  Hold the incentive spirometer in an upright position. INSTRUCTIONS FOR USE  1. Sit on the edge of your bed if possible, or sit up as far as you can in bed or on a chair. 2. Hold the incentive spirometer in an upright position. 3. Breathe out normally. 4. Place the mouthpiece in your mouth and seal your lips tightly around it. 5. Breathe in slowly and as deeply as possible, raising the piston or the ball toward the top of the column. 6. Hold your breath for 3-5 seconds or for as long as possible. Allow the piston or ball to fall to the bottom of the column. 7. Remove the mouthpiece from your mouth and breathe out normally. 8. Rest for a few seconds and repeat Steps 1 through 7 at least 10 times every 1-2 hours when you are awake.  Take your time and take a few normal breaths between deep breaths. 9. The spirometer may include an indicator to show your best effort. Use the indicator as a goal to work toward during each repetition. 10. After each set of 10 deep breaths, practice coughing to be sure your lungs are clear. If you have an incision (the cut made at the time of surgery), support your incision when coughing by placing a pillow or rolled up towels firmly against it. Once you are able to get out of bed, walk around indoors and cough well. You may stop using the incentive spirometer when instructed by your caregiver.  RISKS AND COMPLICATIONS  Take your time so you do not get dizzy or light-headed.  If you are in pain, you may need to take or ask for pain medication before doing incentive spirometry. It is harder to take a deep breath if you are having pain. AFTER USE  Rest and breathe slowly and easily.  It can be helpful to keep track of a log of your progress. Your caregiver can provide you with a simple table to help with this. If you are using the spirometer at home, follow these instructions: SEEK MEDICAL CARE IF:   You are having difficultly using the spirometer.  You have trouble using the spirometer as often as instructed.  Your pain medication is not giving enough relief while using the spirometer.  You develop fever of 100.5 F (38.1 C) or higher. SEEK IMMEDIATE MEDICAL CARE IF:   You cough up bloody sputum that had not been present before.  You develop fever of 102 F (38.9 C) or greater.  You develop worsening pain at or near the incision site. MAKE SURE YOU:   Understand these instructions.  Will watch your condition.  Will get help right away if you are not doing well or get worse. Document Released: 05/01/2006 Document Revised: 03/13/2011 Document Reviewed: 07/02/2006 ExitCare Patient Information 2014 [employer].   ________________________________________________________________________            [facility] - Preparing for [facility] Before surgery, you can play an important role.   Because skin is not sterile, your skin needs to be as free of germs as possible.   You can reduce the number of germs on your skin by washing with CHG (chlorahexidine gluconate) soap before surgery.   CHG is an antiseptic cleaner which kills germs and bonds with the skin to continue killing germs even after washing. Please DO NOT use if you have an allergy to CHG or antibacterial soaps.   If your skin becomes reddened/irritated stop using the CHG and inform your nurse when you arrive at Short Stay. .  You may shave your face/neck. Please follow these instructions carefully:  1.  Shower with CHG Soap the night before surgery and the  morning of Surgery.  2.  If you choose to wash your hair, wash your hair first as usual with your  normal  shampoo.  3.  After you shampoo, rinse your hair and body thoroughly to remove the  shampoo.                                        4.  Use CHG as you would any other liquid soap.  You can apply chg directly  to the skin and wash                       Gently with a scrungie or clean washcloth.  5.  Apply the CHG Soap to your body ONLY FROM THE NECK DOWN.   Do not use on face/ open                           Wound or open sores. Avoid contact with eyes, ears mouth and genitals (private parts).                       Wash face,  Genitals (private parts) with your normal soap.             6.  Wash thoroughly, paying special attention to the area where your surgery  will be performed.  7.  Thoroughly rinse your body with warm water from the neck down.  8.  DO NOT shower/wash with your normal soap after using and rinsing off  the CHG Soap.                9.  Pat yourself dry with a clean towel.            10.  Wear clean pajamas.  11.  Place clean sheets on your bed the night  of your first shower and do not  sleep with pets.  Day of Surgery : Do not apply any lotions/deodorants the morning of surgery.  Please wear clean clothes to the hospital/surgery center.   FAILURE TO FOLLOW THESE INSTRUCTIONS MAY RESULT IN THE CANCELLATION OF YOUR SURGERY PATIENT SIGNATURE_________________________________  NURSE SIGNATURE__________________________________  ________________________________________________________________________

## 2018-07-31 ENCOUNTER — Encounter (HOSPITAL_COMMUNITY): Payer: Self-pay

## 2018-07-31 ENCOUNTER — Other Ambulatory Visit: Payer: Self-pay

## 2018-07-31 ENCOUNTER — Encounter (HOSPITAL_COMMUNITY)
Admission: RE | Admit: 2018-07-31 | Discharge: 2018-07-31 | Disposition: A | Discharge status: Home / Self Care | Payer: PRIVATE HEALTH INSURANCE | Source: Ambulatory Visit | Attending: Orthopedic Surgery | Admitting: Orthopedic Surgery

## 2018-07-31 DIAGNOSIS — Z87891 Personal history of nicotine dependence: Secondary | ICD-10-CM | POA: Diagnosis not present

## 2018-07-31 DIAGNOSIS — M545 Low back pain: Secondary | ICD-10-CM | POA: Diagnosis not present

## 2018-07-31 DIAGNOSIS — W19XXXA Unspecified fall, initial encounter: Secondary | ICD-10-CM | POA: Diagnosis not present

## 2018-07-31 DIAGNOSIS — S82042A Displaced comminuted fracture of left patella, initial encounter for closed fracture: Secondary | ICD-10-CM | POA: Diagnosis present

## 2018-07-31 LAB — BASIC METABOLIC PANEL WITH GFR
Anion gap: 13 (ref 5–15)
BUN: 15 mg/dL (ref 6–20)
CO2: 24 mmol/L (ref 22–32)
Calcium: 9.9 mg/dL (ref 8.9–10.3)
Chloride: 101 mmol/L (ref 98–111)
Creatinine, Ser: 0.86 mg/dL (ref 0.61–1.24)
GFR calc Af Amer: >60 mL/min
GFR calc non Af Amer: >60 mL/min
Glucose, Bld: 101 mg/dL — ABNORMAL HIGH (ref 70–99)
Potassium: 4.2 mmol/L (ref 3.5–5.1)
Sodium: 138 mmol/L (ref 135–145)

## 2018-07-31 LAB — CBC
HCT: 48.9 % (ref 39.0–52.0)
Hemoglobin: 17.2 g/dL — ABNORMAL HIGH (ref 13.0–17.0)
MCH: 34.8 pg — ABNORMAL HIGH (ref 26.0–34.0)
MCHC: 35.2 g/dL (ref 30.0–36.0)
MCV: 99 fL (ref 80.0–100.0)
Platelets: 355 10*3/uL (ref 150–400)
RBC: 4.94 MIL/uL (ref 4.22–5.81)
RDW: 12.1 % (ref 11.5–15.5)
WBC: 6.8 10*3/uL (ref 4.0–10.5)
nRBC: 0 % (ref 0.0–0.2)

## 2018-07-31 LAB — PROTIME-INR
INR: 0.9 (ref 0.8–1.2)
Prothrombin Time: 11.8 seconds (ref 11.4–15.2)

## 2018-07-31 MED ORDER — DEXTROSE 5 % IV SOLN
3.0000 g | INTRAVENOUS | Status: DC
  Filled 2018-07-31: qty 3000

## 2018-07-31 NOTE — Progress Notes (Signed)
Konrad Felix PA  Patient was tachycardic at PAT visit. He reported being anxious and in pain 8/10. He has only been taking tylenol for pain because he didn't feel good taking a  Narcotic.Marland Kitchen EKG was done. He had no other symptoms.  EKG and Labs in [facility]

## 2018-08-01 ENCOUNTER — Encounter (HOSPITAL_COMMUNITY): Payer: Self-pay | Admitting: *Deleted

## 2018-08-01 ENCOUNTER — Encounter (HOSPITAL_COMMUNITY)
Admission: RE | Disposition: A | Discharge status: Home / Self Care | Payer: Self-pay | Source: Home / Self Care | Attending: Orthopedic Surgery

## 2018-08-01 ENCOUNTER — Ambulatory Visit (HOSPITAL_COMMUNITY): Payer: PRIVATE HEALTH INSURANCE

## 2018-08-01 ENCOUNTER — Ambulatory Visit (HOSPITAL_COMMUNITY): Payer: PRIVATE HEALTH INSURANCE | Admitting: Physician Assistant

## 2018-08-01 ENCOUNTER — Ambulatory Visit (HOSPITAL_COMMUNITY)
Admission: RE | Admit: 2018-08-01 | Discharge: 2018-08-01 | Disposition: A | Discharge status: Home / Self Care | Payer: PRIVATE HEALTH INSURANCE | Attending: Orthopedic Surgery | Admitting: Orthopedic Surgery

## 2018-08-01 ENCOUNTER — Ambulatory Visit (HOSPITAL_COMMUNITY): Payer: PRIVATE HEALTH INSURANCE | Admitting: Anesthesiology

## 2018-08-01 DIAGNOSIS — W19XXXA Unspecified fall, initial encounter: Secondary | ICD-10-CM | POA: Insufficient documentation

## 2018-08-01 DIAGNOSIS — Z87891 Personal history of nicotine dependence: Secondary | ICD-10-CM | POA: Insufficient documentation

## 2018-08-01 DIAGNOSIS — S82042A Displaced comminuted fracture of left patella, initial encounter for closed fracture: Secondary | ICD-10-CM | POA: Diagnosis not present

## 2018-08-01 DIAGNOSIS — S82002A Unspecified fracture of left patella, initial encounter for closed fracture: Secondary | ICD-10-CM

## 2018-08-01 DIAGNOSIS — M545 Low back pain: Secondary | ICD-10-CM | POA: Insufficient documentation

## 2018-08-01 DIAGNOSIS — Z419 Encounter for procedure for purposes other than remedying health state, unspecified: Secondary | ICD-10-CM

## 2018-08-01 HISTORY — PX: ORIF PATELLA: SHX5033

## 2018-08-01 SURGERY — OPEN REDUCTION INTERNAL FIXATION (ORIF) PATELLA
Anesthesia: Choice | Laterality: Left

## 2018-08-01 SURGERY — OPEN REDUCTION INTERNAL FIXATION (ORIF) PATELLA
Anesthesia: Regional | Laterality: Left

## 2018-08-01 MED ORDER — PROPOFOL 10 MG/ML IV BOLUS
INTRAVENOUS | Status: AC
  Filled 2018-08-01: qty 20

## 2018-08-01 MED ORDER — FENTANYL CITRATE (PF) 100 MCG/2ML IJ SOLN
INTRAMUSCULAR | Status: AC
  Filled 2018-08-01: qty 2

## 2018-08-01 MED ORDER — OXYCODONE HCL 5 MG/5ML PO SOLN: 5.0000 mg | Freq: Once | ORAL | Status: DC | PRN

## 2018-08-01 MED ORDER — MIDAZOLAM HCL 2 MG/2ML IJ SOLN
INTRAMUSCULAR | Status: AC
  Administered 2018-08-01: 2 mg via INTRAVENOUS
  Filled 2018-08-01: qty 2

## 2018-08-01 MED ORDER — ONDANSETRON HCL 4 MG/2ML IJ SOLN
INTRAMUSCULAR | Status: DC | PRN
  Administered 2018-08-01: 4 mg via INTRAVENOUS

## 2018-08-01 MED ORDER — OXYCODONE HCL 5 MG PO TABS: 5.0000 mg | ORAL_TABLET | Freq: Once | ORAL | Status: DC | PRN

## 2018-08-01 MED ORDER — FENTANYL CITRATE (PF) 100 MCG/2ML IJ SOLN
INTRAMUSCULAR | Status: DC | PRN
  Administered 2018-08-01 (×4): 25 ug via INTRAVENOUS

## 2018-08-01 MED ORDER — SODIUM CHLORIDE 0.9 % IR SOLN
Status: DC | PRN
  Administered 2018-08-01: 1000 mL

## 2018-08-01 MED ORDER — ONDANSETRON HCL 4 MG PO TABS: 4.0000 mg | ORAL_TABLET | Freq: Three times a day (TID) | ORAL | 0 refills | Status: AC | PRN

## 2018-08-01 MED ORDER — FENTANYL CITRATE (PF) 100 MCG/2ML IJ SOLN
50.0000 ug | Freq: Once | INTRAMUSCULAR | Status: AC
  Administered 2018-08-01: 13:00:00 100 ug via INTRAVENOUS

## 2018-08-01 MED ORDER — CHLORHEXIDINE GLUCONATE 4 % EX LIQD: 60.0000 mL | Freq: Once | CUTANEOUS | Status: DC

## 2018-08-01 MED ORDER — PROPOFOL 10 MG/ML IV BOLUS
INTRAVENOUS | Status: AC
  Filled 2018-08-01: qty 40

## 2018-08-01 MED ORDER — PROMETHAZINE HCL 25 MG/ML IJ SOLN: 6.2500 mg | INTRAMUSCULAR | Status: DC | PRN

## 2018-08-01 MED ORDER — ROPIVACAINE HCL 5 MG/ML IJ SOLN
INTRAMUSCULAR | Status: DC | PRN
  Administered 2018-08-01: 30 mL via PERINEURAL

## 2018-08-01 MED ORDER — LACTATED RINGERS IV SOLN
INTRAVENOUS | Status: DC
  Administered 2018-08-01 (×2): via INTRAVENOUS

## 2018-08-01 MED ORDER — LABETALOL HCL 5 MG/ML IV SOLN
INTRAVENOUS | Status: AC
  Filled 2018-08-01: qty 4

## 2018-08-01 MED ORDER — METOCLOPRAMIDE HCL 5 MG/ML IJ SOLN: 5.0000 mg | Freq: Three times a day (TID) | INTRAMUSCULAR | Status: DC | PRN

## 2018-08-01 MED ORDER — PROPOFOL 500 MG/50ML IV EMUL
INTRAVENOUS | Status: DC | PRN
  Administered 2018-08-01: 25 ug/kg/min via INTRAVENOUS

## 2018-08-01 MED ORDER — OXYCODONE HCL 5 MG PO TABS: 5.0000 mg | ORAL_TABLET | Freq: Four times a day (QID) | ORAL | 0 refills | Status: AC | PRN

## 2018-08-01 MED ORDER — HYDROMORPHONE HCL 1 MG/ML IJ SOLN: 0.2500 mg | INTRAMUSCULAR | Status: DC | PRN

## 2018-08-01 MED ORDER — ACETAMINOPHEN 325 MG PO TABS: 325.0000 mg | ORAL_TABLET | Freq: Four times a day (QID) | ORAL | Status: DC | PRN

## 2018-08-01 MED ORDER — FENTANYL CITRATE (PF) 100 MCG/2ML IJ SOLN
INTRAMUSCULAR | Status: AC
  Administered 2018-08-01: 100 ug via INTRAVENOUS
  Filled 2018-08-01: qty 2

## 2018-08-01 MED ORDER — HYDROMORPHONE HCL 2 MG/ML IJ SOLN
INTRAMUSCULAR | Status: AC
  Filled 2018-08-01: qty 1

## 2018-08-01 MED ORDER — MIDAZOLAM HCL 5 MG/5ML IJ SOLN
INTRAMUSCULAR | Status: DC | PRN
  Administered 2018-08-01: 2 mg via INTRAVENOUS

## 2018-08-01 MED ORDER — OXYCODONE HCL 5 MG PO TABS: 5.0000 mg | ORAL_TABLET | ORAL | Status: DC | PRN

## 2018-08-01 MED ORDER — METHOCARBAMOL 500 MG IVPB - SIMPLE MED: 500.0000 mg | Freq: Four times a day (QID) | INTRAVENOUS | Status: DC | PRN

## 2018-08-01 MED ORDER — ASPIRIN 81 MG PO CHEW: 81.0000 mg | CHEWABLE_TABLET | Freq: Two times a day (BID) | ORAL | 0 refills | Status: AC

## 2018-08-01 MED ORDER — OXYCODONE HCL 5 MG PO TABS: 10.0000 mg | ORAL_TABLET | ORAL | Status: DC | PRN

## 2018-08-01 MED ORDER — POVIDONE-IODINE 10 % EX SWAB
2.0000 "application " | Freq: Once | CUTANEOUS | Status: AC
  Administered 2018-08-01: 2 via TOPICAL

## 2018-08-01 MED ORDER — OXYCODONE HCL 5 MG PO TABS: 5.0000 mg | ORAL_TABLET | Freq: Four times a day (QID) | ORAL | 0 refills | Status: DC | PRN

## 2018-08-01 MED ORDER — CEFAZOLIN SODIUM-DEXTROSE 2-3 GM-%(50ML) IV SOLR
INTRAVENOUS | Status: DC | PRN
  Administered 2018-08-01: 2 g via INTRAVENOUS

## 2018-08-01 MED ORDER — LIDOCAINE 2% (20 MG/ML) 5 ML SYRINGE
INTRAMUSCULAR | Status: DC | PRN
  Administered 2018-08-01: 50 mg via INTRAVENOUS

## 2018-08-01 MED ORDER — HYDROMORPHONE HCL 1 MG/ML IJ SOLN: 0.5000 mg | INTRAMUSCULAR | Status: DC | PRN

## 2018-08-01 MED ORDER — ONDANSETRON HCL 4 MG/2ML IJ SOLN: 4.0000 mg | Freq: Four times a day (QID) | INTRAMUSCULAR | Status: DC | PRN

## 2018-08-01 MED ORDER — ISOPROPYL ALCOHOL 70 % SOLN
Status: DC | PRN
  Administered 2018-08-01: 1 via TOPICAL

## 2018-08-01 MED ORDER — ONDANSETRON HCL 4 MG PO TABS
4.0000 mg | ORAL_TABLET | Freq: Four times a day (QID) | ORAL | Status: DC | PRN
  Filled 2018-08-01: qty 1

## 2018-08-01 MED ORDER — ONDANSETRON HCL 4 MG PO TABS: 4.0000 mg | ORAL_TABLET | Freq: Three times a day (TID) | ORAL | 0 refills | Status: DC | PRN

## 2018-08-01 MED ORDER — KETOROLAC TROMETHAMINE 30 MG/ML IJ SOLN: 30.0000 mg | Freq: Once | INTRAMUSCULAR | Status: DC | PRN

## 2018-08-01 MED ORDER — CEFAZOLIN SODIUM-DEXTROSE 2-4 GM/100ML-% IV SOLN
INTRAVENOUS | Status: AC
  Filled 2018-08-01: qty 100

## 2018-08-01 MED ORDER — HYDROMORPHONE HCL 1 MG/ML IJ SOLN
INTRAMUSCULAR | Status: DC | PRN
  Administered 2018-08-01: 1 mg via INTRAVENOUS
  Administered 2018-08-01 (×2): 0.5 mg via INTRAVENOUS

## 2018-08-01 MED ORDER — MIDAZOLAM HCL 2 MG/2ML IJ SOLN
1.0000 mg | Freq: Once | INTRAMUSCULAR | Status: AC
  Administered 2018-08-01: 2 mg via INTRAVENOUS

## 2018-08-01 MED ORDER — PROPOFOL 10 MG/ML IV BOLUS
INTRAVENOUS | Status: DC | PRN
  Administered 2018-08-01: 200 mg via INTRAVENOUS

## 2018-08-01 MED ORDER — HYDRALAZINE HCL 20 MG/ML IJ SOLN
10.0000 mg | Freq: Once | INTRAMUSCULAR | Status: AC
  Administered 2018-08-01: 16:00:00 10 mg via INTRAVENOUS

## 2018-08-01 MED ORDER — MIDAZOLAM HCL 2 MG/2ML IJ SOLN
INTRAMUSCULAR | Status: AC
  Filled 2018-08-01: qty 2

## 2018-08-01 MED ORDER — HYDRALAZINE HCL 20 MG/ML IJ SOLN
INTRAMUSCULAR | Status: AC
  Administered 2018-08-01: 10 mg via INTRAVENOUS
  Filled 2018-08-01: qty 1

## 2018-08-01 MED ORDER — LABETALOL HCL 5 MG/ML IV SOLN
10.0000 mg | INTRAVENOUS | Status: DC | PRN
  Administered 2018-08-01: 17:00:00 10 mg via INTRAVENOUS

## 2018-08-01 MED ORDER — METOCLOPRAMIDE HCL 5 MG PO TABS: 5.0000 mg | ORAL_TABLET | Freq: Three times a day (TID) | ORAL | Status: DC | PRN

## 2018-08-01 MED ORDER — ACETAMINOPHEN 500 MG PO TABS
1000.0000 mg | ORAL_TABLET | Freq: Once | ORAL | Status: AC
  Administered 2018-08-01: 1000 mg via ORAL
  Filled 2018-08-01: qty 2

## 2018-08-01 MED ORDER — DEXAMETHASONE SODIUM PHOSPHATE 10 MG/ML IJ SOLN
INTRAMUSCULAR | Status: DC | PRN
  Administered 2018-08-01: 10 mg via INTRAVENOUS

## 2018-08-01 MED ORDER — METHOCARBAMOL 500 MG PO TABS: 500.0000 mg | ORAL_TABLET | Freq: Four times a day (QID) | ORAL | Status: DC | PRN

## 2018-08-01 SURGICAL SUPPLY — 73 items
BAG ZIPLOCK 12X15 (MISCELLANEOUS) ×3 IMPLANT
BANDAGE ESMARK 6X9 LF (GAUZE/BANDAGES/DRESSINGS) ×1 IMPLANT
BIT DRILL 2.5X2.75 QC CALB (BIT) ×2 IMPLANT
BLADE HEX COATED 2.75 (ELECTRODE) ×2 IMPLANT
BNDG COHESIVE 6X5 TAN STRL LF (GAUZE/BANDAGES/DRESSINGS) ×1 IMPLANT
BNDG ELASTIC 6X5.8 VLCR STR LF (GAUZE/BANDAGES/DRESSINGS) ×2 IMPLANT
BNDG ESMARK 6X9 LF (GAUZE/BANDAGES/DRESSINGS) ×6
BNDG GAUZE ELAST 4 BULKY (GAUZE/BANDAGES/DRESSINGS) ×1 IMPLANT
CHLORAPREP W/TINT 26 (MISCELLANEOUS) ×3 IMPLANT
COUNTER NEEDLE 20 DBL MAG RED (NEEDLE) ×2 IMPLANT
COVER MAYO STAND STRL (DRAPES) ×2 IMPLANT
COVER SURGICAL LIGHT HANDLE (MISCELLANEOUS) ×3 IMPLANT
COVER WAND RF STERILE (DRAPES) IMPLANT
CUFF TOURN SGL QUICK 34 (TOURNIQUET CUFF) ×2
CUFF TOURN SGL QUICK 42 (TOURNIQUET CUFF) IMPLANT
CUFF TRNQT CYL 34X4.125X (TOURNIQUET CUFF) IMPLANT
DECANTER SPIKE VIAL GLASS SM (MISCELLANEOUS) ×3 IMPLANT
DERMABOND ADVANCED (GAUZE/BANDAGES/DRESSINGS) ×2
DERMABOND ADVANCED .7 DNX12 (GAUZE/BANDAGES/DRESSINGS) IMPLANT
DRAPE C-ARM 42X120 X-RAY (DRAPES) ×2 IMPLANT
DRAPE C-ARMOR (DRAPES) ×2 IMPLANT
DRAPE IMP U-DRAPE 54X76 (DRAPES) ×2 IMPLANT
DRAPE INCISE IOBAN 66X45 STRL (DRAPES) ×3 IMPLANT
DRAPE SHEET LG 3/4 BI-LAMINATE (DRAPES) ×3 IMPLANT
DRAPE U-SHAPE 47X51 STRL (DRAPES) ×3 IMPLANT
DRSG AQUACEL AG ADV 3.5X 6 (GAUZE/BANDAGES/DRESSINGS) ×2 IMPLANT
DRSG EMULSION OIL 3X16 NADH (GAUZE/BANDAGES/DRESSINGS) ×1 IMPLANT
DRSG PAD ABDOMINAL 8X10 ST (GAUZE/BANDAGES/DRESSINGS) ×1 IMPLANT
ELECT REM PT RETURN 15FT ADLT (MISCELLANEOUS) ×3 IMPLANT
GAUZE SPONGE 4X4 12PLY STRL (GAUZE/BANDAGES/DRESSINGS) ×1 IMPLANT
GLOVE BIO SURGEON STRL SZ8.5 (GLOVE) ×9 IMPLANT
GLOVE BIOGEL PI IND STRL 8.5 (GLOVE) ×1 IMPLANT
GLOVE BIOGEL PI INDICATOR 8.5 (GLOVE) ×2
GLOVE ECLIPSE 8.0 STRL XLNG CF (GLOVE) IMPLANT
GLOVE ORTHO TXT STRL SZ7.5 (GLOVE) ×6 IMPLANT
GLOVE SURG ORTHO 8.0 STRL STRW (GLOVE) ×3 IMPLANT
GOWN SPEC L3 XXLG W/TWL (GOWN DISPOSABLE) ×4 IMPLANT
GOWN STRL REUS W/TWL LRG LVL3 (GOWN DISPOSABLE) ×3 IMPLANT
IMMOBILIZER KNEE 20 (SOFTGOODS) ×3
IMMOBILIZER KNEE 20 THIGH 36 (SOFTGOODS) IMPLANT
K-WIRE ACE 1.6X6 (WIRE) ×6
KIT TURNOVER KIT A (KITS) IMPLANT
KWIRE ACE 1.6X6 (WIRE) IMPLANT
MANIFOLD NEPTUNE II (INSTRUMENTS) ×3 IMPLANT
MARKER SKIN DUAL TIP RULER LAB (MISCELLANEOUS) ×2 IMPLANT
NS IRRIG 1000ML POUR BTL (IV SOLUTION) ×3 IMPLANT
PACK TOTAL JOINT (CUSTOM PROCEDURE TRAY) ×3 IMPLANT
PAD CAST 4YDX4 CTTN HI CHSV (CAST SUPPLIES) ×1 IMPLANT
PADDING CAST COTTON 4X4 STRL (CAST SUPPLIES) ×2
PASSER SUT SWANSON 36MM LOOP (INSTRUMENTS) ×5 IMPLANT
PROTECTOR NERVE ULNAR (MISCELLANEOUS) ×3 IMPLANT
RETRIEVER SUT HEWSON (MISCELLANEOUS) ×2 IMPLANT
SCREW ACE CAN 4.0 42M (Screw) ×2 IMPLANT
SCREW ACE CAN 4.0 44M (Screw) ×2 IMPLANT
SPONGE LAP 18X18 RF (DISPOSABLE) ×3 IMPLANT
SPONGE LAP 4X18 RFD (DISPOSABLE) ×3 IMPLANT
STAPLER VISISTAT 35W (STAPLE) ×3 IMPLANT
SUT BROADBAND TAPE 2PK 1.5 (SUTURE) ×2 IMPLANT
SUT ETHIBOND NAB CT1 #1 30IN (SUTURE) ×2 IMPLANT
SUT FIBERWIRE #2 38 T-5 BLUE (SUTURE)
SUT MON AB 2-0 CT1 36 (SUTURE) ×4 IMPLANT
SUT VIC AB 0 CT1 27 (SUTURE)
SUT VIC AB 0 CT1 27XBRD ANTBC (SUTURE) ×2 IMPLANT
SUT VIC AB 1 CT1 27 (SUTURE) ×6
SUT VIC AB 1 CT1 27XBRD ANTBC (SUTURE) ×2 IMPLANT
SUT VIC AB 2-0 CT1 27 (SUTURE)
SUT VIC AB 2-0 CT1 TAPERPNT 27 (SUTURE) ×2 IMPLANT
SUTURE FIBERWR #2 38 T-5 BLUE (SUTURE) ×2 IMPLANT
TOWEL OR 17X26 10 PK STRL BLUE (TOWEL DISPOSABLE) ×4 IMPLANT
WATER STERILE IRR 1000ML POUR (IV SOLUTION) ×1 IMPLANT
WRAP KNEE MAXI GEL POST OP (GAUZE/BANDAGES/DRESSINGS) ×2 IMPLANT
YANKAUER SUCT BULB TIP 10FT TU (MISCELLANEOUS) ×2 IMPLANT
broadband tape ×2 IMPLANT

## 2018-08-01 NOTE — H&P (Signed)
PREOPERATIVE H&P  Chief Complaint: Displaced left patella fracture  HPI: Julian Martinez is a 38 y.o. male who presents for preoperative history and physical with a diagnosis of Displaced left patella fracture.  He has elected for surgical management.   Past Medical History:  Diagnosis Date  . Chronic bilateral low back pain without sciatica 11/02/2014   Past Surgical History:  Procedure Laterality Date  . COLONOSCOPY     Social History   Socioeconomic History  . Marital status: Single    Spouse name: Not on file  . Number of children: Not on file  . Years of education: Not on file  . Highest education level: Not on file  Occupational History  . Not on file  Social Needs  . Financial resource strain: Not on file  . Food insecurity    Worry: Not on file    Inability: Not on file  . Transportation needs    Medical: Not on file    Non-medical: Not on file  Tobacco Use  . Smoking status: Former Smoker    Packs/day: 0.25    Years: 2.00    Pack years: 0.50    Types: Cigarettes    Quit date: 2005    Years since quitting: 15.5  . Smokeless tobacco: Never Used  Substance and Sexual Activity  . Alcohol use: Yes    Alcohol/week: 16.0 standard drinks    Types: 2 Cans of beer, 14 Standard drinks or equivalent per week    Comment: daily  . Drug use: Yes    Types: Marijuana    Comment: a little every day  . Sexual activity: Yes  Lifestyle  . Physical activity    Days per week: Not on file    Minutes per session: Not on file  . Stress: Not on file  Relationships  . Social Herbalist on phone: Not on file    Gets together: Not on file    Attends religious service: Not on file    Active member of club or organization: Not on file    Attends meetings of clubs or organizations: Not on file    Relationship status: Not on file  Other Topics Concern  . Not on file  Social History Narrative  . Not on file   Family History  Problem Relation Age of Onset  .  Heart disease Paternal Grandmother   . Healthy Mother   . Healthy Father    No Known Allergies Prior to Admission medications   Medication Sig Start Date End Date Taking? Authorizing Provider  acetaminophen (TYLENOL) 500 MG tablet Take 1,000 mg by mouth every 6 (six) hours as needed for moderate pain.   Yes [provider]  HYDROcodone-acetaminophen (NORCO/VICODIN) 5-325 MG tablet Take 1-2 tablets by mouth every 6 (six) hours as needed for moderate pain or severe pain. Patient taking differently: Take 0.5 tablets by mouth every 6 (six) hours as needed for moderate pain or severe pain.  07/22/18  Yes Melynda Ripple, MD  ibuprofen (ADVIL) 800 MG tablet Take 1 tablet (800 mg total) by mouth every 8 (eight) hours as needed for moderate pain. Patient not taking: Reported on 07/26/2018 06/28/18   Raylene Everts, MD     Positive ROS: All other systems have been reviewed and were otherwise negative with the exception of those mentioned in the HPI and as above.  Physical Exam: General: Alert, no acute distress Cardiovascular: No pedal edema Respiratory: No cyanosis, no use of accessory  musculature GI: No organomegaly, abdomen is soft and non-tender Skin: No lesions in the area of chief complaint Neurologic: Sensation intact distally Psychiatric: Patient is competent for consent with normal mood and affect Lymphatic: No axillary or cervical lymphadenopathy  MUSCULOSKELETAL: Left knee skin intact. Cannot do SLR. NVI.  Assessment: Displaced left patella fracture  Plan: Plan for Procedure(s): OPEN REDUCTION INTERNAL (ORIF) FIXATION PATELLA  The risks benefits and alternatives were discussed with the patient including but not limited to the risks of nonoperative treatment, versus surgical intervention including infection, bleeding, nerve injury, malunion, nonunion, blood clots, cardiopulmonary complications, morbidity, mortality, among others, and they were willing to proceed.    Jonette PesaBrian J Taelyn Nemes, MD Cell [phone]   08/01/2018 1:32 PM

## 2018-08-01 NOTE — Transfer of Care (Signed)
Immediate Anesthesia Transfer of Care Note  Patient: Julian Martinez  Procedure(s) Performed: OPEN REDUCTION INTERNAL (ORIF) FIXATION PATELLA (Left )  Patient Location: PACU  Anesthesia Type:General  Level of Consciousness: drowsy, patient cooperative and responds to stimulation  Airway & Oxygen Therapy: Patient Spontanous Breathing and Patient connected to face mask oxygen  Post-op Assessment: Report given to RN and Post -op Vital signs reviewed and stable  Post vital signs: Reviewed and stable  Last Vitals:  Vitals Value Taken Time  BP    Temp    Pulse 109 08/01/18 1530  Resp    SpO2 100 % 08/01/18 1530  Vitals shown include unvalidated device data.  Last Pain:  Vitals:   08/01/18 1206  TempSrc: Oral         Complications: No apparent anesthesia complications

## 2018-08-01 NOTE — Discharge Instructions (Signed)
Wear knee immobilizer at all times. Do NOT bend your knee. Weight bearing as tolerated left leg in knee immobilizer.

## 2018-08-01 NOTE — Op Note (Signed)
OPERATIVE REPORT   08/01/2018  3:08 PM  PATIENT:  Julian Martinez   SURGEON:  Jonette PesaBrian J Delphine Sizemore, MD  ASSISTANT:  Wynelle FannyBrooke Townsend, RNFA.   PREOPERATIVE DIAGNOSIS:  Displaced left patella fracture.  POSTOPERATIVE DIAGNOSIS:  Same.  PROCEDURE:  1. OPEN REDUCTION INTERNAL (ORIF) FIXATION LEFT PATELLA. 2. Interpretation of fluoroscopic images.  ANESTHESIA:   GETA.  ANTIBIOTICS:  2 g Ancef.  IMPLANTS:  Biomet 4.0 mm cannulated screws x2. Broadband tape.  SPECIMENS:  None.  COMPLICATIONS:  None.  DISPOSITION:  Stable to PACU.  SURGICAL INDICATIONS:  Julian ElkSteven Tayag is a 38 y.o. male with a diagnosis of Displaced left patella fracture after he fell onto the left knee.  X-rays showed a comminuted left patella fracture with greater than 2 mm of displacement.  He was unable to perform a straight leg raise.  He was indicated for ORIF of the patella.  The risks, benefits, and alternatives were discussed with the patient preoperatively including but not limited to the risks of infection, bleeding, nerve / blood vessel injury, malunion, nonunion, cardiopulmonary complications, the need for repeat surgery, among others, and the patient was willing to proceed.  PROCEDURE IN DETAIL: Identified the patient holding area using 2 identifiers.  The surgical site was marked by myself.  He was taken the operating room, placed supine on the operating room table.  General anesthesia was induced.  All bony prominences were well-padded.  Nonsterile tourniquet is applied to the left upper thigh.  The operative lower extremity was positioned on a bone foam positioner.  The left lower extremity was prepped and draped in the normal sterile surgical fashion.  Timeout was called, verifying site and site of surgery.  He did receive IV antibiotics within 60 minutes of beginning the procedure.  Esmarch exsanguination was used, the tourniquet was elevated to 300 mmHg.  Longitudinal incision was made over the  patella.  Full-thickness skin flaps were created.  The extensor retinaculum was completely intact.  A large point-to-point clamp was used to reduce the fracture.  A total of 2 guidepins for cannulated screws were placed in a retrograde fashion.  Fracture reduction and pin position was checked on AP and lateral fluoroscopy views.  The length of the guidepin was measured, and the pins were sequentially drilled and screws were placed.  The fracture site compressed together nicely.  I used a [place] suture passer to place a broadband suture through the 2 screws in a figure-of-eight pattern.  The knee was brought into full extension and the suture was tightened.  Final AP and lateral fluoroscopy views were used to confirm hardware placement and fracture reduction.    The tourniquet was let down.  Meticulous hemostasis was achieved with Bovie electrocautery.  The knee was then irrigated with copious saline.  The wound was closed in layers with 2-0 Monocryl for the deep dermal layer, staples for the skin.  Dermabond was applied to the skin.  Once the glue was fully dried, an Aquasol dressing was placed.  Bulky compression dressing was placed.  Knee immobilizer was placed.  Patient was then awakened from anesthesia and taken to the PACU in stable condition.  Sponge, needle, and instrument counts were correct at the end of the case x2.  There were no known complications.  POSTOPERATIVE PLAN: Postoperatively, the patient will be discharged home from the PACU.  Knee immobilizer at all times.  Do not bend the knee.  Weightbearing as tolerated with knee immobilizer.  Begin low-dose aspirin for DVT prophylaxis.  I will see him back in 2 weeks for suture removal and to advance him on the patella fracture postop protocol.

## 2018-08-01 NOTE — Anesthesia Procedure Notes (Deleted)
Performed by: Thedore Pickel M, CRNA       

## 2018-08-01 NOTE — Anesthesia Preprocedure Evaluation (Addendum)
Anesthesia Evaluation  Patient identified by MRN, date of birth, ID band Patient awake    Reviewed: Allergy & Precautions, NPO status , Patient's Chart, lab work & pertinent test results  Airway Mallampati: II  TM Distance: >3 FB Neck ROM: Full    Dental no notable dental hx.    Pulmonary former smoker,    Pulmonary exam normal breath sounds clear to auscultation       Cardiovascular negative cardio ROS Normal cardiovascular exam Rhythm:Regular Rate:Normal     Neuro/Psych negative neurological ROS  negative psych ROS   GI/Hepatic negative GI ROS, (+)     substance abuse  ,   Endo/Other  negative endocrine ROS  Renal/GU negative Renal ROS     Musculoskeletal negative musculoskeletal ROS (+) Chronic bilateral low back pain without sciatica   Abdominal   Peds  Hematology negative hematology ROS (+)   Anesthesia Other Findings Displaced left patella fracture  Reproductive/Obstetrics                            Anesthesia Physical Anesthesia Plan  ASA: II  Anesthesia Plan: General and Regional   Post-op Pain Management: GA combined w/ Regional for post-op pain   Induction: Intravenous  PONV Risk Score and Plan: 2 and Ondansetron, Dexamethasone, Midazolam and Treatment may vary due to age or medical condition  Airway Management Planned: LMA  Additional Equipment:   Intra-op Plan:   Post-operative Plan: Extubation in OR  Informed Consent: I have reviewed the patients History and Physical, chart, labs and discussed the procedure including the risks, benefits and alternatives for the proposed anesthesia with the patient or authorized representative who has indicated his/her understanding and acceptance.     Dental advisory given  Plan Discussed with: CRNA  Anesthesia Plan Comments:        Anesthesia Quick Evaluation

## 2018-08-01 NOTE — Progress Notes (Signed)
Assisted Dr. Ellender with left, ultrasound guided, adductor canal block. Side rails up, monitors on throughout procedure. See vital signs in flow sheet. Tolerated Procedure well.  

## 2018-08-01 NOTE — Anesthesia Procedure Notes (Signed)
Anesthesia Regional Block: Adductor canal block   Pre-Anesthetic Checklist: ,, timeout performed, Correct Patient, Correct Site, Correct Laterality, Correct Procedure,, site marked, risks and benefits discussed, Surgical consent,  Pre-op evaluation,  At surgeon's request and post-op pain management  Laterality: Left  Prep: chloraprep       Needles:  Injection technique: Single-shot  Needle Type: Echogenic Stimulator Needle     Needle Length: 9cm  Needle Gauge: 21     Additional Needles:   Procedures:,,,, ultrasound used (permanent image in chart),,,,  Narrative:  Start time: 08/01/2018 12:50 PM End time: 08/01/2018 1:00 PM Injection made incrementally with aspirations every 5 mL.  Performed by: Personally  Anesthesiologist: Murvin Natal, MD  Additional Notes: Functioning IV was confirmed and monitors were applied. A time-out was performed. Hand hygiene and sterile gloves were used. The thigh was placed in a frog-leg position and prepped in a sterile fashion. A 70mm 21ga Arrow echogenic stimulator needle was placed using ultrasound guidance.  Negative aspiration and negative test dose prior to incremental administration of local anesthetic. The patient tolerated the procedure well.

## 2018-08-01 NOTE — Anesthesia Procedure Notes (Addendum)
Procedure Name: LMA Insertion Date/Time: 08/01/2018 1:48 PM Performed by: Anne Fu, CRNA Pre-anesthesia Checklist: Patient identified, Emergency Drugs available, Suction available, Patient being monitored and Timeout performed Patient Re-evaluated:Patient Re-evaluated prior to induction Oxygen Delivery Method: Circle system utilized Preoxygenation: Pre-oxygenation with 100% oxygen Induction Type: IV induction Ventilation: Mask ventilation without difficulty LMA: LMA inserted LMA Size: 4.0 Number of attempts: 1 Placement Confirmation: positive ETCO2 and breath sounds checked- equal and bilateral Tube secured with: Tape

## 2018-08-02 ENCOUNTER — Encounter (HOSPITAL_COMMUNITY): Payer: Self-pay | Admitting: Orthopedic Surgery

## 2018-08-05 NOTE — Anesthesia Postprocedure Evaluation (Signed)
Anesthesia Post Note  Patient: Remmington Urieta  Procedure(s) Performed: OPEN REDUCTION INTERNAL (ORIF) FIXATION PATELLA (Left )     Patient location during evaluation: PACU Anesthesia Type: Regional and General Level of consciousness: awake and alert Pain management: pain level controlled Vital Signs Assessment: post-procedure vital signs reviewed and stable Respiratory status: spontaneous breathing, nonlabored ventilation, respiratory function stable and patient connected to nasal cannula oxygen Cardiovascular status: blood pressure returned to baseline and stable Postop Assessment: no apparent nausea or vomiting Anesthetic complications: no    Last Vitals:  Vitals:   08/01/18 1702 08/01/18 1705  BP:  (!) 126/91  Pulse: 100 (!) 101  Resp: 15 13  Temp:    SpO2: 100% 97%    Last Pain:  Vitals:   08/01/18 1638  TempSrc:   PainSc: 0-No pain                 Ellwood Steidle

## 2020-09-29 IMAGING — DX LEFT KNEE - 1-2 VIEW
2 series · 2 of 2 positions shown · non-contrast
Comparison: None.

CLINICAL DATA: ORIF patellar fracture.

EXAM:
LEFT KNEE - 1-2 VIEW

[knee lat (1 of 2)]
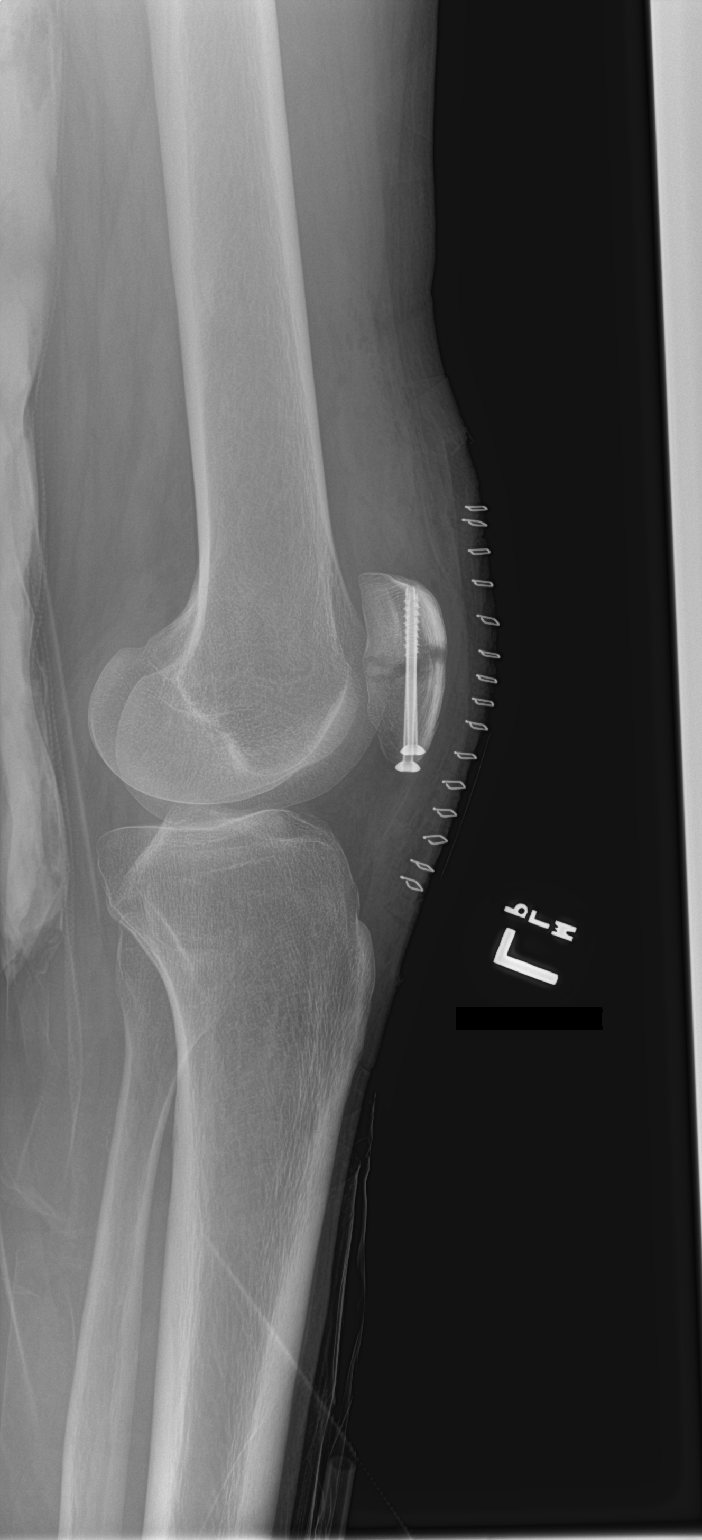

[knee lat (2 of 2)]
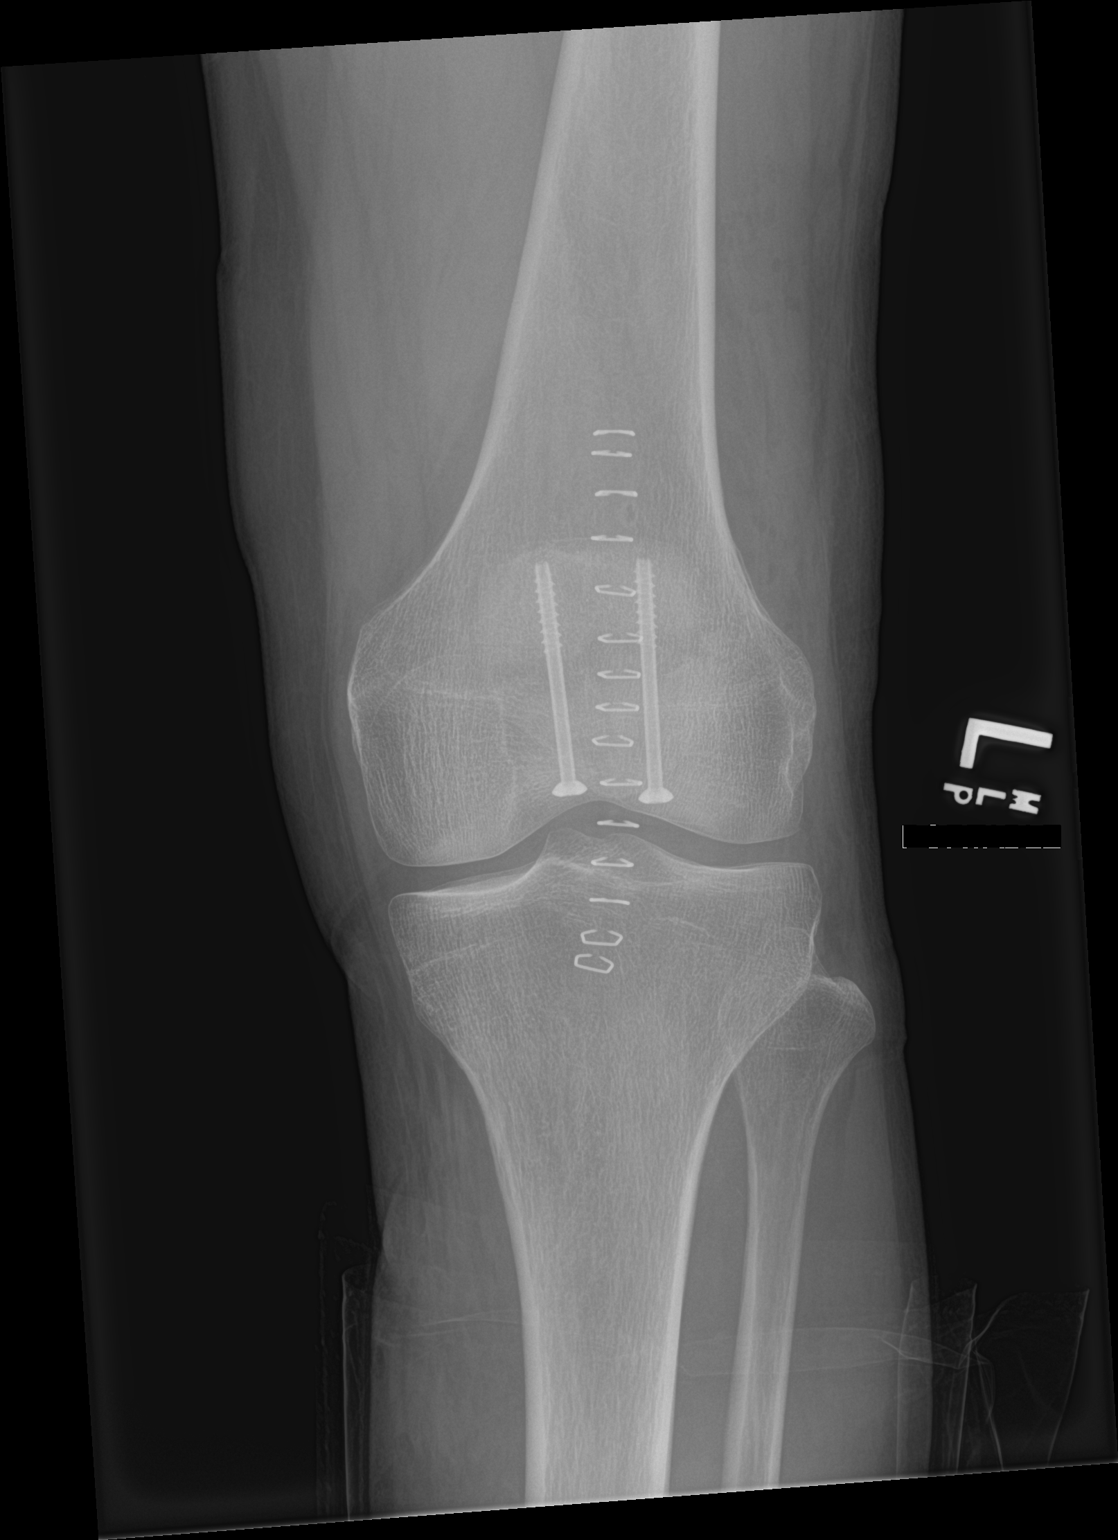

[2 of 2 positions shown; findings below may reference images not displayed]

FINDINGS: Two screws traverses a transverse patellar fracture, in
near-anatomic alignment and position. No complicating features are
noted.
IMPRESSION: ORIF patellar fracture, in near-anatomic alignment and position.
